# Patient Record
Sex: Male | Born: 1945 | Race: White | Hispanic: No | Marital: Married | Smoking: Former smoker
Health system: Southern US, Community
[De-identification: ages and names within clinical notes are randomized; demographics above are authoritative.]

## PROBLEM LIST (undated history)

## (undated) DIAGNOSIS — K219 Gastro-esophageal reflux disease without esophagitis: Secondary | ICD-10-CM

## (undated) DIAGNOSIS — M1990 Unspecified osteoarthritis, unspecified site: Secondary | ICD-10-CM

## (undated) DIAGNOSIS — E785 Hyperlipidemia, unspecified: Secondary | ICD-10-CM

## (undated) DIAGNOSIS — C4490 Unspecified malignant neoplasm of skin, unspecified: Secondary | ICD-10-CM

## (undated) DIAGNOSIS — R7301 Impaired fasting glucose: Secondary | ICD-10-CM

## (undated) DIAGNOSIS — K589 Irritable bowel syndrome without diarrhea: Secondary | ICD-10-CM

## (undated) HISTORY — DX: Unspecified osteoarthritis, unspecified site: M19.90

## (undated) HISTORY — DX: Impaired fasting glucose: R73.01

## (undated) HISTORY — DX: Unspecified malignant neoplasm of skin, unspecified: C44.90

## (undated) HISTORY — PX: WRIST SURGERY: SHX841

## (undated) HISTORY — DX: Irritable bowel syndrome without diarrhea: K58.9

## (undated) HISTORY — DX: Hyperlipidemia, unspecified: E78.5

## (undated) HISTORY — DX: Gastro-esophageal reflux disease without esophagitis: K21.9

## (undated) HISTORY — PX: TONSILLECTOMY: SUR1361

---

## 2001-08-08 ENCOUNTER — Encounter: Payer: Self-pay | Admitting: *Deleted

## 2001-08-08 ENCOUNTER — Emergency Department (HOSPITAL_COMMUNITY): Admission: EM | Admit: 2001-08-08 | Discharge: 2001-08-08 | Payer: Self-pay | Admitting: *Deleted

## 2002-06-27 ENCOUNTER — Ambulatory Visit (HOSPITAL_COMMUNITY): Admission: RE | Admit: 2002-06-27 | Discharge: 2002-06-27 | Payer: Self-pay | Admitting: Internal Medicine

## 2002-06-27 HISTORY — PX: COLONOSCOPY: SHX174

## 2002-10-30 ENCOUNTER — Encounter: Payer: Self-pay | Admitting: Orthopedic Surgery

## 2002-11-05 ENCOUNTER — Inpatient Hospital Stay (HOSPITAL_COMMUNITY): Admission: RE | Admit: 2002-11-05 | Discharge: 2002-11-08 | Payer: Self-pay | Admitting: Orthopedic Surgery

## 2002-11-05 HISTORY — PX: TOTAL KNEE ARTHROPLASTY: SHX125

## 2002-11-27 ENCOUNTER — Encounter (HOSPITAL_COMMUNITY): Admission: RE | Admit: 2002-11-27 | Discharge: 2002-12-27 | Payer: Self-pay | Admitting: Orthopedic Surgery

## 2004-06-15 ENCOUNTER — Emergency Department (HOSPITAL_COMMUNITY): Admission: EM | Admit: 2004-06-15 | Discharge: 2004-06-15 | Payer: Self-pay | Admitting: Emergency Medicine

## 2005-08-06 ENCOUNTER — Ambulatory Visit (HOSPITAL_COMMUNITY): Admission: RE | Admit: 2005-08-06 | Discharge: 2005-08-06 | Payer: Self-pay | Admitting: Urology

## 2005-08-06 HISTORY — PX: OTHER SURGICAL HISTORY: SHX169

## 2009-09-06 HISTORY — PX: TOTAL KNEE ARTHROPLASTY: SHX125

## 2009-09-24 ENCOUNTER — Inpatient Hospital Stay (HOSPITAL_COMMUNITY): Admission: RE | Admit: 2009-09-24 | Discharge: 2009-09-27 | Payer: Self-pay | Admitting: Orthopedic Surgery

## 2010-03-27 IMAGING — CR DG CHEST 2V
2 series · 2 of 2 positions shown · non-contrast
Comparison: 06/15/2004

CLINICAL DATA: Preadmission workup.  DJD right knee.

CHEST - 2 VIEW

[view not recorded (1 of 2)]
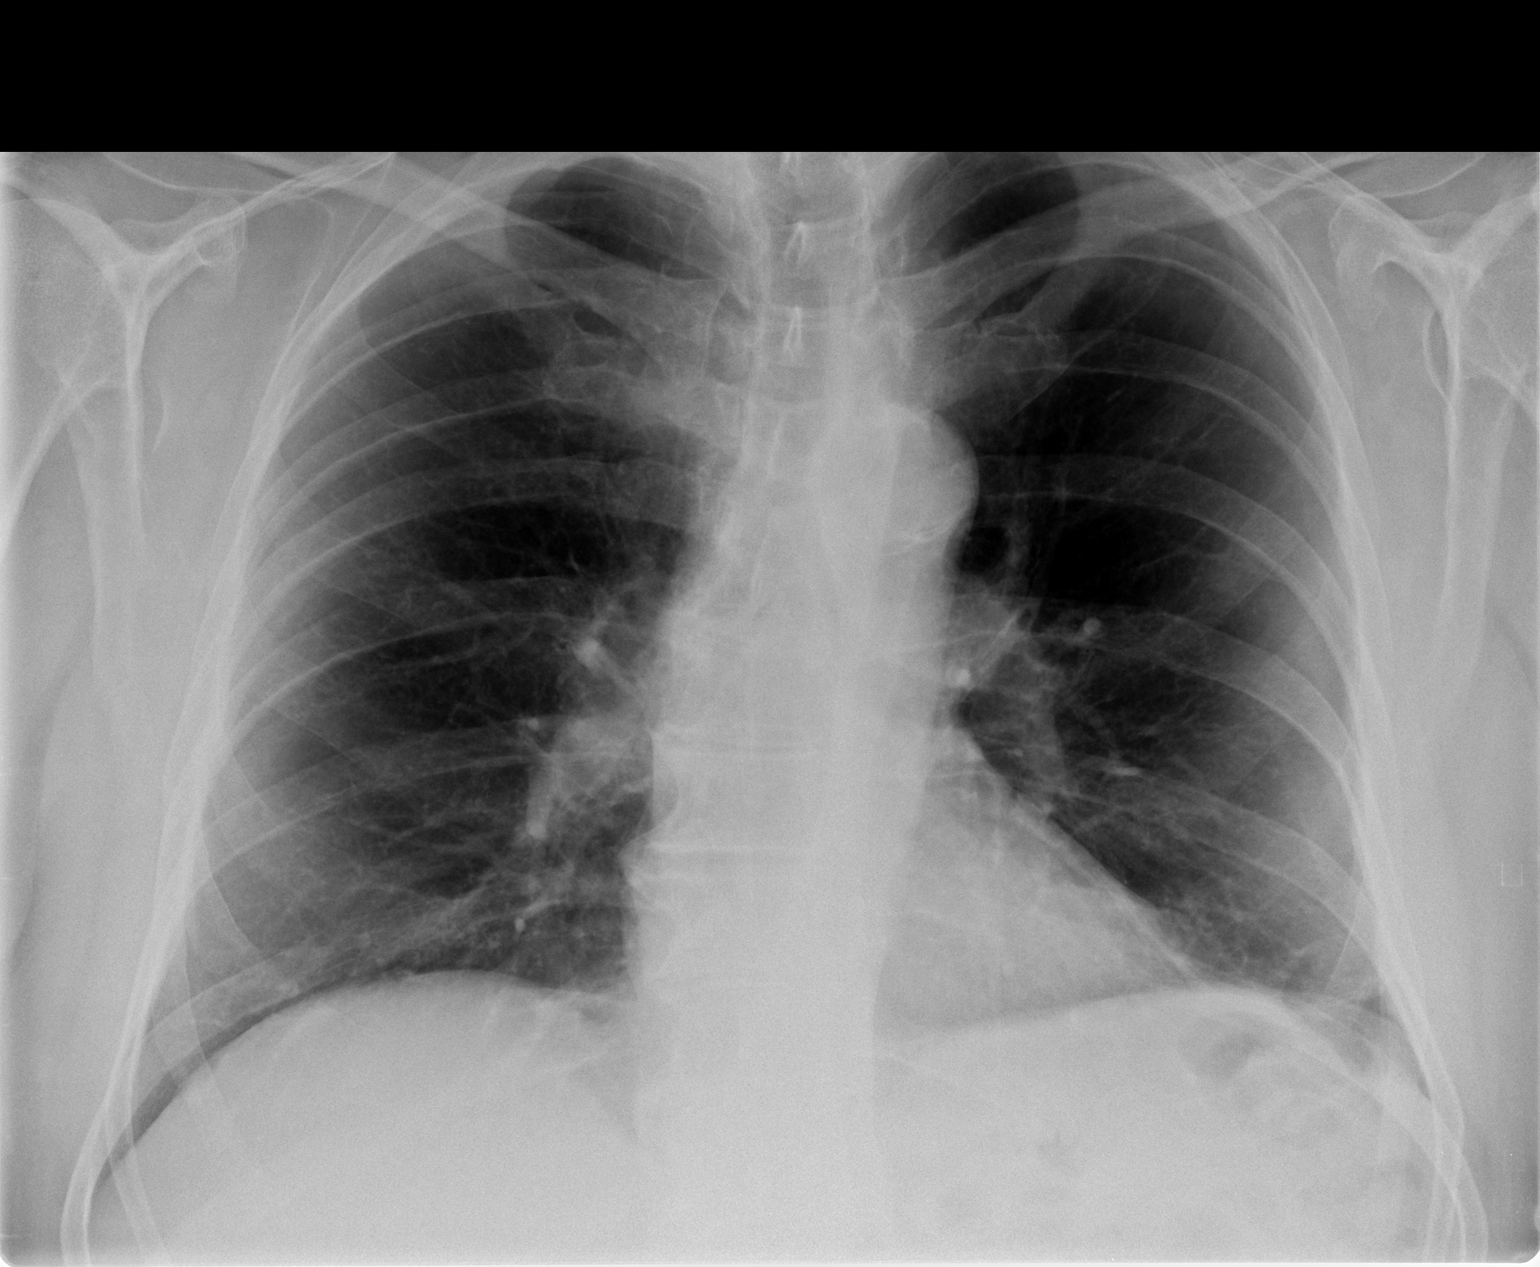

[view not recorded (2 of 2)]
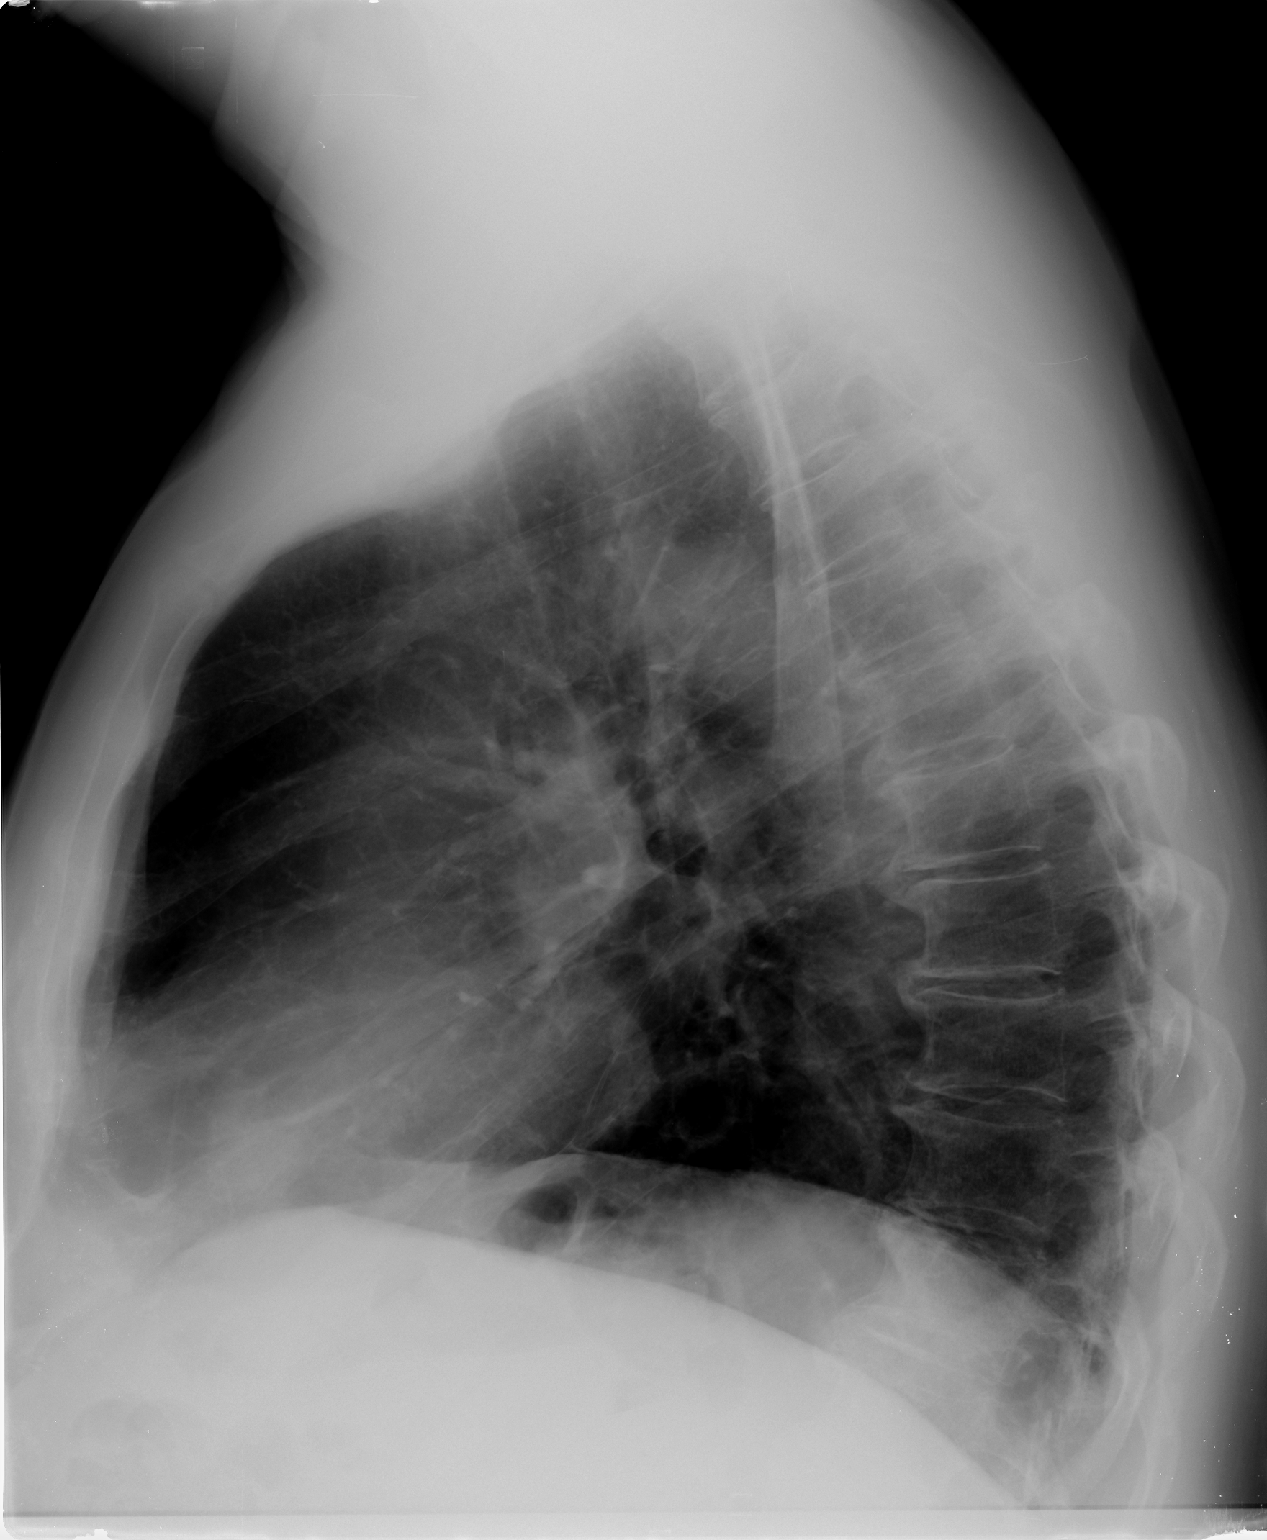

[2 of 2 positions shown; findings below may reference images not displayed]

FINDINGS: Normal cardiomediastinal silhouette.  No active pulmonary
process.  Minimal scarring at the left lateral base.  Spondylosis
of the dorsal spine.
IMPRESSION: No active chest disease.

## 2010-10-19 ENCOUNTER — Encounter (HOSPITAL_COMMUNITY): Payer: Self-pay | Admitting: Physical Therapy

## 2010-11-22 LAB — DIFFERENTIAL
Basophils Absolute: 0 10*3/uL (ref 0.0–0.1)
Basophils Relative: 1 % (ref 0–1)
Neutro Abs: 4.5 10*3/uL (ref 1.7–7.7)
Neutrophils Relative %: 52 % (ref 43–77)

## 2010-11-22 LAB — URINALYSIS, ROUTINE W REFLEX MICROSCOPIC
Bilirubin Urine: NEGATIVE
Ketones, ur: NEGATIVE mg/dL
Nitrite: NEGATIVE
Urobilinogen, UA: 0.2 mg/dL (ref 0.0–1.0)

## 2010-11-22 LAB — CBC
HCT: 43.9 % (ref 39.0–52.0)
HCT: 31.5 % — ABNORMAL LOW (ref 39.0–52.0)
HCT: 32.6 % — ABNORMAL LOW (ref 39.0–52.0)
HCT: 36.6 % — ABNORMAL LOW (ref 39.0–52.0)
Hemoglobin: 15.1 g/dL (ref 13.0–17.0)
Hemoglobin: 11 g/dL — ABNORMAL LOW (ref 13.0–17.0)
Hemoglobin: 11.5 g/dL — ABNORMAL LOW (ref 13.0–17.0)
Hemoglobin: 12.4 g/dL — ABNORMAL LOW (ref 13.0–17.0)
MCHC: 34.5 g/dL (ref 30.0–36.0)
MCHC: 34.9 g/dL (ref 30.0–36.0)
MCV: 88.4 fL (ref 78.0–100.0)
MCV: 88.9 fL (ref 78.0–100.0)
MCV: 90 fL (ref 78.0–100.0)
Platelets: 291 10*3/uL (ref 150–400)
Platelets: 190 10*3/uL (ref 150–400)
RBC: 4.97 MIL/uL (ref 4.22–5.81)
RBC: 3.51 MIL/uL — ABNORMAL LOW (ref 4.22–5.81)
RBC: 4.07 MIL/uL — ABNORMAL LOW (ref 4.22–5.81)
RDW: 13.3 % (ref 11.5–15.5)
RDW: 13.5 % (ref 11.5–15.5)
WBC: 8.8 10*3/uL (ref 4.0–10.5)
WBC: 9.6 10*3/uL (ref 4.0–10.5)

## 2010-11-22 LAB — BASIC METABOLIC PANEL
CO2: 26 mEq/L (ref 19–32)
GFR calc non Af Amer: >60 mL/min (ref >60)
Glucose, Bld: 158 mg/dL — ABNORMAL HIGH (ref 70–99)
Potassium: 3.7 mEq/L (ref 3.5–5.1)
Sodium: 136 mEq/L (ref 135–145)

## 2010-11-22 LAB — COMPREHENSIVE METABOLIC PANEL
ALT: 21 U/L (ref 0–53)
AST: 21 U/L (ref 0–37)
Albumin: 4.5 g/dL (ref 3.5–5.2)
Alkaline Phosphatase: 41 U/L (ref 39–117)
BUN: 19 mg/dL (ref 6–23)
CO2: 25 mEq/L (ref 19–32)
Calcium: 9.9 mg/dL (ref 8.4–10.5)
Chloride: 103 mEq/L (ref 96–112)
Creatinine, Ser: 0.98 mg/dL (ref 0.4–1.5)
GFR calc Af Amer: >60 mL/min (ref >60)
GFR calc non Af Amer: >60 mL/min (ref >60)
Glucose, Bld: 107 mg/dL — ABNORMAL HIGH (ref 70–99)
Potassium: 4.6 mEq/L (ref 3.5–5.1)
Sodium: 141 mEq/L (ref 135–145)
Total Bilirubin: 0.8 mg/dL (ref 0.3–1.2)
Total Protein: 6.9 g/dL (ref 6.0–8.3)

## 2010-11-22 LAB — TYPE AND SCREEN

## 2010-11-22 LAB — PROTIME-INR
INR: 0.96 (ref 0.00–1.49)
Prothrombin Time: 16 seconds — ABNORMAL HIGH (ref 11.6–15.2)

## 2010-11-22 LAB — APTT: aPTT: 26 seconds (ref 24–37)

## 2011-01-22 NOTE — Op Note (Signed)
Michael Adkins, Michael Adkins                         ACCOUNT NO.:  [id number]   MEDICAL RECORD NO.:  [id number]                   PATIENT TYPE:  INP   LOCATION:  2872                                 FACILITY:  [facility]   PHYSICIAN:  Harvie Junior, [name]                DATE OF BIRTH:  07-02-1946   DATE OF PROCEDURE:  11/05/2002  DATE OF DISCHARGE:                                 OPERATIVE REPORT   PREOPERATIVE DIAGNOSIS:  Degenerative joint disease, left knee, end stage.   POSTOPERATIVE DIAGNOSIS:  Degenerative joint disease, left knee, end stage.   PROCEDURE:  Left total knee replacement.   SURGEON:  Harvie Junior, [name]   ASSISTANT:  Marshia Ly, P.A.   ANESTHESIA:  General.   BRIEF HISTORY:  The patient is a 64 year old male with a long history of  having degenerative joint disease of his left knee.  Ultimately, he had  tried anti-inflammatory medication, activity modification, bracing; and none  of this seemed to work.  Because of continued complaint of pain, the patient  is ultimately taken to the operating room for a left total knee replacement.   PROCEDURE IN DETAIL:  The patient was taken to the operating room and after  adequate anesthesia was obtained with general anesthetic, the patient was  laid supine on the operating table.  His left leg was then prepped and  draped in the usual sterile fashion.  Following Esmarch exsanguination of  the extremity, the tourniquet was inflated to 350 mmHg.  Following this, a  midline incision was made through subcutaneous tissues and down to the level  of the extensor mechanism which was clearly identified, and a medial  parapatellar arthrotomy was undertaken.  Following this, medial and lateral  meniscectomies were performed as well as osteophyte removal, anterior and  posterior cruciate excision.  Following this, attention was turned towards  the tibia, where a cut was made perpendicular to the long axis of the tibia  with a 10-degree  posterior slope.  Following this, attention was turned  towards the femur where the femur was sized to a large, and the rotational  block was used for establishing the anterior and posterior cuts.  Following  this, attention was turned towards the flexion gap which was measured at 10  The block was put rotationally in place at this point, and the  anterior/posterior cuts were made at this point.  Following this, the 4-  degree valgus cutting block was used, and the distal femoral cut was made to  match the flexion gap to the extension gap.  At this time, a lollipop guide  was used to make sure that the knee would come easily into full extension,  and it would.  Slight hyperextension was observed.  At this point, attention  was turned back towards the chamfer cuts.  The cutting guide was put in  place, locked in place, and anterior  and posterior chamfers were cut as well  as the __________ being drilled.  Following this, the tibia was sized to 5,  and the wedges were put in place.  Following this, attention was turned  towards removal of all trial components, and the knee was put through a  range of motion with the trials in place.  The patella was then sized and  cut to 38.  Following this, a full range of motion was achieved easily with  the trial components.  Following this, all the components were removed.  The  knee was copiously irrigated and suctioned dry with pulse lavage irrigation,  and the final components were cemented into place; a size-5 cemented keel  tibia, large femoral component, 10-degree bridging bearing and a 38-mm all-  polyethylene patella.  Following this, the stability was checked and range  of motion was checked, and all were felt to be adequate.  A medium Hemovac  drain was put in place at this point, and the medial parapatellar  retinaculum was closed after all excess cement was removed and the cement  was allowed to harden.  The medial parapatellar arthrotomy was  closed with  #1 Vicryl running suture.  The subcuticular was closed with #0 and 2-0  Vicryl, and the skin was closed with staples.  A sterile compressive  dressing was applied.  The patient was taken to the recovery room and noted  to be satisfactory condition.  Estimated blood loss during the procedure was  none.                                                 Harvie Junior, [name]    Ranae Plumber  D:  11/05/2002  T:  11/05/2002  Job:  [id number]   cc:   Kingsley Callander. Ouida Sills, [name]  [address]  [place]  [place] [address]  Fax: [phone]

## 2011-01-22 NOTE — Op Note (Signed)
Michael Adkins, Michael Adkins                         ACCOUNT NO.:  [id number]   MEDICAL RECORD NO.:  [id number]                   PATIENT TYPE:  AMB   LOCATION:  DAY                                  FACILITY:  [facility]   PHYSICIAN:  R. Roetta Sessions, [name]              DATE OF BIRTH:  21-Feb-1946   DATE OF PROCEDURE:  06/27/2002  DATE OF DISCHARGE:                                 OPERATIVE REPORT   PROCEDURE:  Screening colonoscopy.   INDICATION FOR PROCEDURE:  The patient is a 65 year old gentleman referred  for colorectal cancer screening.  He is followed by Kingsley Callander. Ouida Sills, [name]  He  had a sigmoidoscopy done some five years ago, and no significant findings  were reported.  Colonoscopy is now being done as a standard screening  maneuver.  He has never had his entire lower GI tract evaluated.  There is  no family history of colorectal neoplasia, and the patient has no GI  symptoms.  Risks, benefits, and alternatives have been reviewed, questions  answered.  I believe he is at low risk for conscious sedation.   DESCRIPTION OF PROCEDURE:  O2 saturation, blood pressure, pulse, and  respiration were monitored throughout the entire procedure.  Conscious  sedation with Versed 3 mg IV, Demerol 75 mg IV.   Instrument:  Olympus video chip adult colonoscope.   Findings:  The digital rectal exam revealed no abnormalities.   Endoscopic findings:  Prep was good.   Rectum:  Examination of the rectal mucosa including retroflexed view of the  anal verge revealed only internal hemorrhoids.   Colon:  The colonic mucosa was surveyed from the rectosigmoid junction to  the left, transverse, right colon, to the area of the appendiceal orifice,  ileocecal valve, and cecum.  These structures were well-seen and  photographed for the record.  The patient was noted to have left-sided  diverticula.  The remainder of the colonic mucosa appeared normal to the  cecum.  From the level of the cecum and the ileocecal  valve, the scope was  slowly and cautiously withdrawn.  All previously-mentioned mucosal surfaces  were again seen, and again no other abnormalities were observed.  The  patient tolerated the procedure well, was reactivated in endoscopy.   IMPRESSION:  1. Internal hemorrhoids, otherwise normal rectum.  2. Left-sided diverticula.  The remaining colonic mucosa appeared normal.    RECOMMENDATIONS:  1. Diverticulosis literature provided to the patient.  2. Daily fiber supplementation.  3. Repeat colonoscopy 10 years.                                                 Jonathon Bellows, [name]    RMR/MEDQ  D:  06/27/2002  T:  06/27/2002  Job:  [id number]   cc:  Kingsley Callander. Ouida Sills, [name]

## 2011-01-22 NOTE — Discharge Summary (Signed)
NAMEDUAINE, RADIN                         ACCOUNT NO.:  [id number]   MEDICAL RECORD NO.:  [id number]                   PATIENT TYPE:  INP   LOCATION:  5040                                 FACILITY:  [facility]   PHYSICIAN:  Harvie Junior, [name]                DATE OF BIRTH:  04/17/1946   DATE OF ADMISSION:  11/05/2002  DATE OF DISCHARGE:  11/08/2002                                 DISCHARGE SUMMARY   ADMISSION DIAGNOSES:  1. End-stage degenerative joint disease left knee.  2. History of gastroesophageal reflux disease.   DISCHARGE DIAGNOSES:  1. End-stage degenerative joint disease left knee.  2. History of gastroesophageal reflux disease.   PROCEDURE:  Left total knee arthroplasty per Dr. Jodi Geralds on November 05, 2002.   HISTORY OF PRESENT ILLNESS:  The patient is a 65 year old male who has a  long history of pain in his left knee.  He has a history of an arthrotomy in  the left knee in the 1960s.  He had a left knee scope done in 1987.  X-ray  showed bone on bone arthritis.  He had night pain and pain on ambulation and  had gotten only temporary relief with the use of anti-inflammatory  medication and Cortisone injections.  Because of his persistent problems and  radiographic findings, he was felt to be a candidate for a total knee  replacement and was admitted for this.   PERTINENT LABORATORY DATA:  EKG showed normal sinus rhythm.   Hemoglobin on admission was 14.3, hematocrit 41.3.  On postoperative day #1,  hemoglobin was 12.2; on postoperative day #2, it was 10.8; on postoperative  day #3, it was 11.4.  Protime on admission was 13 seconds and INR of 0.9,  PTT was 26.  On the day of discharge his protime was 14 seconds with INR of  1.1.  CMET was entirely normal, sodium was decreased at 131 and glucose  slightly elevated at 155.  Urinalysis showed no abnormalities.   HOSPITAL COURSE:  The patient underwent left total knee replacement as well  described in Dr. Luiz Blare'  operative note.  He was treated with an epidural  Fentanyl catheter for postoperative pain control per anesthesia.  He was put  on IV Ancef 1 g q.8h. x6 doses and was started on Coumadin on postoperative  day #1 since he had the epidural in place.   On postoperative day #1, he had moderate left knee pain.  He was taking  fluids without difficulty.  His Foley catheter which had been placed at the  time of surgery was intact.  He was out of bed to the chair.  He had a low  grade fever of 100.8.  His hemoglobin was 12.2 and his INR was 1.1.  His  potassium was 3.6.  Physical therapy saw the patient for walker ambulation  and weightbearing as tolerated.  He also continued  to use the CPM machine.   On postoperative day #2, he was without complaints.  He had spiked a fever  up to 101 but was then found to be afebrile.  His hemoglobin was stable at  10.8.  His INR was 1.2.  His dressing was changed and his Hemovac drain was  pulled.  His epidural was also discontinued per anesthesia.  He was started  on Lovenox subcutaneous six hours after the epidural was discontinued to  decrease the risk of DVT.  On postoperative day #3, the patient was doing  well.  He was taking no pain medication.  He had stable hemoglobin.  His  lungs were clear to auscultation.  __________ was intact to his left lower  extremity.  His calf was soft.  His left knee wound was benign.  He was  discharged home in improved condition.   ACTIVITY:  He will be weightbearing as tolerated on the left with a walker.  He will use a home CPM machine for range of motion.  He will get home health  physical therapy.   DISCHARGE MEDICATIONS:  1. Percocet p.r.n. for pain.  2. Coumadin antithrombolytic therapy per pharmacy protocol x1 month     postoperatively.  3. Lovenox 30 mg subcutaneous b.i.d. until his INR is greater than 2.0.   FOLLOW UP:  He will follow up with Dr. Luiz Blare in two weeks, call if any  problems occur.      Marshia Ly, P.A.                       Harvie Junior, [name]    Cordelia Pen  D:  01/09/2003  T:  01/11/2003  Job:  [id number]   cc:   Kingsley Callander. Ouida Sills, [name]  [address]  [place]  [place] [address]  Fax: [phone]

## 2011-01-22 NOTE — Op Note (Signed)
NAMECYLAS, FALZONE               ACCOUNT NO.:  [id number]   MEDICAL RECORD NO.:  [id number]          PATIENT TYPE:  AMB   LOCATION:  DAY                           FACILITY:  [facility]   PHYSICIAN:  Ky Barban, M.D.DATE OF BIRTH:  03-27-1946   DATE OF PROCEDURE:  08/06/2005  DATE OF DISCHARGE:                                 OPERATIVE REPORT   PREOPERATIVE DIAGNOSIS:  Left hydrocele.   POSTOPERATIVE DIAGNOSIS:  Left hydrocele.   PROCEDURE:  Left hydrocelectomy.   ANESTHESIA:  Spinal.   PROCEDURE NOTE:  The patient under spinal anesthesia, usual prep and drape.  Left hemi-scrotum was held by the assistant. Transverse incision across the  mid scrotum is married, carried down to the fascial layers with the help of  cautery and tunica vaginalis was exposed. It was opened. About 100 cc of  clear, amber-colored fluid was drained. The testicle is delivered through  it, and the hydrocele track is everted, and starting from one side of the  hydrocele track. Imbricating stitches placed using 3-0 Vicryl. The sac was  imbricated next to the testicle. After circumferentially closing the  hydrocele sac, there was one section of the sac going towards the spermatic  cord in the groin. It was everted. Then it was also imbricated  circumferentially using 3-0 Vicryl stitch. The testicle was replaced. There  was no bleeding. Some of the bleeders were coagulated, and then the fascial  layers were closed with continuous stitch of 3-0 Vicryl horizontal mattress  stitch in a locking fashion. The skin was closed with horizontal mattress  stitch using 3-0 chromic. Bacitracin ointment applied to the incision, and a  fluff dressing was applied. The patient left the operating room in  satisfactory condition.      Ky Barban, [name]  Electronically Signed     MIJ/MEDQ  D:  08/06/2005  T:  08/06/2005  Job:  [id number]

## 2012-09-08 LAB — CBC WITH DIFFERENTIAL/PLATELET
HCT: 45 %
Hemoglobin: 15.5 g/dL (ref 13.5–17.5)
WBC: 9
platelet count: 299

## 2012-09-08 LAB — COMPREHENSIVE METABOLIC PANEL
ALT: 7 U/L — AB (ref 10–40)
AST: 16 U/L
Alkaline Phosphatase: 16 U/L
Calcium: 9.9 mg/dL
Potassium: 5.7 mmol/L
Sodium: 142 mmol/L (ref 137–147)
Total Bilirubin: 46 mg/dL
Total Protein: 4.6 g/dL

## 2012-10-02 ENCOUNTER — Encounter: Payer: Self-pay | Admitting: Internal Medicine

## 2012-10-03 ENCOUNTER — Encounter (HOSPITAL_COMMUNITY): Payer: Self-pay | Admitting: Pharmacy Technician

## 2012-10-03 ENCOUNTER — Ambulatory Visit (INDEPENDENT_AMBULATORY_CARE_PROVIDER_SITE_OTHER): Payer: Medicare Other | Admitting: Urgent Care

## 2012-10-03 ENCOUNTER — Encounter: Payer: Self-pay | Admitting: Urgent Care

## 2012-10-03 VITALS — BP 130/80 | HR 84 | Temp 98.4°F | Ht 72.0 in | Wt 237.4 lb

## 2012-10-03 DIAGNOSIS — K921 Melena: Secondary | ICD-10-CM

## 2012-10-03 DIAGNOSIS — E875 Hyperkalemia: Secondary | ICD-10-CM

## 2012-10-03 DIAGNOSIS — K219 Gastro-esophageal reflux disease without esophagitis: Secondary | ICD-10-CM | POA: Insufficient documentation

## 2012-10-03 MED ORDER — PEG 3350-KCL-NA BICARB-NACL 420 G PO SOLR: 4000.0000 mL | ORAL | Status: DC

## 2012-10-03 NOTE — Progress Notes (Signed)
Results faxed to PCP 

## 2012-10-03 NOTE — Progress Notes (Signed)
Referring Provider: No ref. provider found Primary Care Physician:  Carylon Perches, MD Primary Gastroenterologist:  Dr. Jonette Eva  Chief Complaint  Patient presents with  . Colonoscopy  . Gastrophageal Reflux    HPI:  Michael Adkins is a 67 y.o. male here as a referral from Dr. Ouida Sills for hemoccult positive stool to set up colonoscopy.  Mr. Schwabe has seen small to moderate amounts of bright red blood with wiping on the toilet tissue for several months.  Occasionally he will strain with hard stools.  Usually has a BM daily or QOD.  He does not take any medications for constipation.  Denies any NSAIDS.  He reports acid reflux heartburn & indigestion at least several times per week.  He takes pepcid prn with good results.  He denies dysphagia or odynophagia.   Denies nausea, vomiting, or anorexia.   Denies diarrhea, melena or weight loss.  He is due for colonosocpy as his last colonoscopy was in 2003 by Dr Jena Gauss where he was found to have internal hemorrhoids & left-sided diverticulosis.  He carries hx IBS but states he really has not had any problems with it recently.  Labs reviewed from [facility] show K 5.7, glucose 114.  He gives no hx CKD or meds that would cause this.  Hgb is 15.5.  CBC & CMP otherwise normal, PSA normal.   Past Medical History  Diagnosis Date  . Osteoarthritis   . Hyperlipidemia   . Impaired fasting glucose   . IBS (irritable bowel syndrome)   . Skin cancer     cannot remember type, [facility]    Past Surgical History  Procedure Date  . Colonoscopy 06/27/2002    RMR: Internal hemorrhoids, otherwise normal rectum/ Left-sided diverticula.  The remaining colonic mucosa appeared normal  . Total knee arthroplasty 11/05/2002    left  . Left hydrocele 08/06/2005  . Total knee arthroplasty 2011    right  . Wrist surgery   . Tonsillectomy     Current Outpatient Prescriptions  Medication Sig Dispense Refill  . aspirin 81 MG tablet Take 81 mg by mouth daily.      .  famotidine (PEPCID) 10 MG tablet Take 10 mg by mouth 2 (two) times daily. PRN      . simvastatin (ZOCOR) 40 MG tablet Take 40 mg by mouth at bedtime.       . polyethylene glycol-electrolytes (TRILYTE) 420 G solution Take 4,000 mLs by mouth as directed.  4000 mL  0    Allergies as of 10/03/2012  . (No Known Allergies)    Family History:There is no known family history of colorectal carcinoma, liver disease, or inflammatory bowel disease.  Problem Relation Age of Onset  . Colon polyps Brother 58  . Lung cancer Brother 81  . COPD Mother     History   Social History  . Marital Status: Married    Spouse Name: N/A    Number of Children: N/A  . Years of Education: N/A   Occupational History  . retired, Administrator, arts runner    Social History Main Topics  . Smoking status: Former Smoker -- 1.0 packs/day for 30 years    Types: Cigarettes    Quit date: 10/03/1990  . Smokeless tobacco: Not on file  . Alcohol Use: No  . Drug Use: No  . Sexually Active: Not on file   Other Topics Concern  . Not on file   Social History Narrative   Lives w/ wife, 1 son (step)  Review of Systems: Gen: Denies any fever, chills, sweats, anorexia, fatigue, weakness, malaise, weight loss, and sleep disorder CV: Denies chest pain, angina, palpitations, syncope, orthopnea, PND, peripheral edema, and claudication. Resp: Denies dyspnea at rest, dyspnea with exercise, cough, sputum, wheezing, coughing up blood, and pleurisy. GI: Denies vomiting blood, jaundice, and fecal incontinence. GU : Denies urinary burning, blood in urine, urinary frequency, urinary hesitancy, nocturnal urination, and urinary incontinence. MS: Denies joint pain, limitation of movement, and swelling, stiffness, low back pain, extremity pain. Denies muscle weakness, cramps, atrophy.  Derm: Denies rash, itching, dry skin, hives, moles, warts, or unhealing ulcers.  Psych: Denies depression, anxiety, memory loss, suicidal ideation,  hallucinations, paranoia, and confusion. Heme: Denies bruising or enlarged lymph nodes. Neuro:  Denies any headaches, dizziness, paresthesias. Endo:  Denies any problems with DM, thyroid, adrenal function.  Physical Exam: BP 130/80  Pulse 84  Temp 98.4 F (36.9 C) (Oral)  Ht 6' (1.829 m)  Wt 237 lb 6.4 oz (107.684 kg)  BMI 32.20 kg/m2 No LMP for male patient. General:   Alert,  Well-developed, well-nourished, pleasant and cooperative in NAD Head:  Normocephalic and atraumatic. Eyes:  Sclera clear, no icterus.   Conjunctiva pink. Ears:  Normal auditory acuity. Nose:  No deformity, discharge, or lesions. Mouth:  No deformity or lesions,oropharynx pink & moist. Neck:  Supple; no masses or thyromegaly. Lungs:  Clear throughout to auscultation.   No wheezes, crackles, or rhonchi. No acute distress. Heart:  Regular rate and rhythm; no murmurs, clicks, rubs,  or gallops. Abdomen:  Normal bowel sounds.  No bruits.  Soft, non-tender and non-distended without masses, hepatosplenomegaly or hernias noted.  No guarding or rebound tenderness.   Rectal:  Deferred. Msk:  Symmetrical without gross deformities. Normal posture. Pulses:  Normal pulses noted. Extremities:  No clubbing or edema. Neurologic:  Alert and oriented x4;  grossly normal neurologically. Skin:  Intact without significant lesions or rashes. Lymph Nodes:  No significant cervical adenopathy. Psych:  Alert and cooperative. Normal mood and affect.

## 2012-10-03 NOTE — Progress Notes (Signed)
Quick Note:  Please let pt know his K is barely elevated This should be OK for colonoscopy as prep will decrease potassium. However, he should follow up with his PCP for high potassium. ZO:XWRUE,AVW, MD  ______

## 2012-10-03 NOTE — Patient Instructions (Addendum)
Follow up with Dr Carylon Perches, MD for your blood pressure Colonoscopy & EGD (upper endoscopy)  with Dr Darrick Penna Gastroesophageal Reflux Disease, Adult Gastroesophageal reflux disease (GERD) happens when acid from your stomach flows up into the esophagus. When acid comes in contact with the esophagus, the acid causes soreness (inflammation) in the esophagus. Over time, GERD may create small holes (ulcers) in the lining of the esophagus. CAUSES   Increased body weight. This puts pressure on the stomach, making acid rise from the stomach into the esophagus.  Smoking. This increases acid production in the stomach.  Drinking alcohol. This causes decreased pressure in the lower esophageal sphincter (valve or ring of muscle between the esophagus and stomach), allowing acid from the stomach into the esophagus.  Late evening meals and a full stomach. This increases pressure and acid production in the stomach.  A malformed lower esophageal sphincter. Sometimes, no cause is found. SYMPTOMS   Burning pain in the lower part of the mid-chest behind the breastbone and in the mid-stomach area. This may occur twice a week or more often.  Trouble swallowing.  Sore throat.  Dry cough.  Asthma-like symptoms including chest tightness, shortness of breath, or wheezing. DIAGNOSIS  Your caregiver may be able to diagnose GERD based on your symptoms. In some cases, X-rays and other tests may be done to check for complications or to check the condition of your stomach and esophagus. TREATMENT  Your caregiver may recommend over-the-counter or prescription medicines to help decrease acid production. Ask your caregiver before starting or adding any new medicines.  HOME CARE INSTRUCTIONS   Change the factors that you can control. Ask your caregiver for guidance concerning weight loss, quitting smoking, and alcohol consumption.  Avoid foods and drinks that make your symptoms worse, such as:  Caffeine or alcoholic  drinks.  Chocolate.  Peppermint or mint flavorings.  Garlic and onions.  Spicy foods.  Citrus fruits, such as oranges, lemons, or limes.  Tomato-based foods such as sauce, chili, salsa, and pizza.  Fried and fatty foods.  Avoid lying down for the 3 hours prior to your bedtime or prior to taking a nap.  Eat small, frequent meals instead of large meals.  Wear loose-fitting clothing. Do not wear anything tight around your waist that causes pressure on your stomach.  Raise the head of your bed 6 to 8 inches with wood blocks to help you sleep. Extra pillows will not help.  Only take over-the-counter or prescription medicines for pain, discomfort, or fever as directed by your caregiver.  Do not take aspirin, ibuprofen, or other nonsteroidal anti-inflammatory drugs (NSAIDs). SEEK IMMEDIATE MEDICAL CARE IF:   You have pain in your arms, neck, jaw, teeth, or back.  Your pain increases or changes in intensity or duration.  You develop nausea, vomiting, or sweating (diaphoresis).  You develop shortness of breath, or you faint.  Your vomit is green, yellow, black, or looks like coffee grounds or blood.  Your stool is red, bloody, or black. These symptoms could be signs of other problems, such as heart disease, gastric bleeding, or esophageal bleeding. MAKE SURE YOU:   Understand these instructions.  Will watch your condition.  Will get help right away if you are not doing well or get worse. Document Released: 06/02/2005 Document Revised: 11/15/2011 Document Reviewed: 03/12/2011 [facility] Patient Information 2013 [place], [place]. Bloody Stools Bloody stools often mean that there is a problem in the digestive tract. Your caregiver may use the term "melena" to describe black, tarry,  and bad smelling stools or "hematochezia" to describe red or maroon-colored stools. Blood seen in the stool can be caused by bleeding anywhere along the intestinal tract.  A black stool usually means  that blood is coming from the upper part of the gastrointestinal tract (esophagus, stomach, or small bowel). Passing maroon-colored stools or bright red blood usually means that blood is coming from lower down in the large bowel or the rectum. However, sometimes massive bleeding in the stomach or small intestine can cause bright red bloody stools.  Consuming black licorice, lead, iron pills, medicines containing bismuth subsalicylate, or blueberries can also cause black stools. Your caregiver can test black stools to see if blood is present. It is important that the cause of the bleeding be found. Treatment can then be started, and the problem can be corrected. Rectal bleeding may not be serious, but you should not assume everything is okay until you know the cause.It is very important to follow up with your caregiver or a specialist in gastrointestinal problems. CAUSES  Blood in the stools can come from various underlying causes.Often, the cause is not found during your first visit. Testing is often needed to discover the cause of bleeding in the gastrointestinal tract. Causes range from simple to serious or even life-threatening.Possible causes include:  Hemorrhoids.These are veins that are full of blood (engorged) in the rectum. They cause pain, inflammation, and may bleed.  Anal fissures.These are areas of painful tearing which may bleed. They are often caused by passing hard stool.  Diverticulosis.These are pouches that form on the colon over time, with age, and may bleed significantly.  Diverticulitis.This is inflammation in areas with diverticulosis. It can cause pain, fever, and bloody stools, although bleeding is rare.  Proctitis and colitis. These are inflamed areas of the rectum or colon. They may cause pain, fever, and bloody stools.  Polyps and cancer. Colon cancer is a leading cause of preventable cancer death.It often starts out as precancerous polyps that can be removed during  a colonoscopy, preventing progression into cancer. Sometimes, polyps and cancer may cause rectal bleeding.  Gastritis and ulcers.Bleeding from the upper gastrointestinal tract (near the stomach) may travel through the intestines and produce black, sometimes tarry, often bad smelling stools. In certain cases, if the bleeding is fast enough, the stools may not be black, but red and the condition may be life-threatening. SYMPTOMS  You may have stools that are bright red and bloody, that are normal color with blood on them, or that are dark black and tarry. In some cases, you may only have blood in the toilet bowl. Any of these cases need medical care. You may also have:  Pain at the anus or anywhere in the rectum.  Lightheadedness or feeling faint.  Extreme weakness.  Nausea or vomiting.  Fever. DIAGNOSIS Your caregiver may use the following methods to find the cause of your bleeding:  Taking a medical history. Age is important. Older people tend to develop polyps and cancer more often. If there is anal pain and a hard, large stool associated with bleeding, a tear of the anus may be the cause. If blood drips into the toilet after a bowel movement, bleeding hemorrhoids may be the problem. The color and frequency of the bleeding are additional considerations. In most cases, the medical history provides clues, but seldom the final answer.  A visual and finger (digital) exam. Your caregiver will inspect the anal area, looking for tears and hemorrhoids. A finger exam can provide  information when there is tenderness or a growth inside. In men, the prostate is also examined.  Endoscopy. Several types of small, long scopes (endoscopes) are used to view the colon.  In the office, your caregiver may use a rigid, or more commonly, a flexible viewing sigmoidoscope. This exam is called flexible sigmoidoscopy. It is performed in 5 to 10 minutes.  A more thorough exam is accomplished with a colonoscope. It  allows your caregiver to view the entire 5 to 6 foot long colon. Medicine to help you relax (sedative) is usually given for this exam. Frequently, a bleeding lesion may be present beyond the reach of the sigmoidoscope. So, a colonoscopy may be the best exam to start with. Both exams are usually done on an outpatient basis. This means the patient does not stay overnight in the hospital or surgery center.  An upper endoscopy may be needed to examine your stomach. Sedation is used and a flexible endoscope is put in your mouth, down to your stomach.  A barium enema X-ray. This is an X-ray exam. It uses liquid barium inserted by enema into the rectum. This test alone may not identify an actual bleeding point. X-rays highlight abnormal shadows, such as those made by lumps (tumors), diverticuli, or colitis. TREATMENT  Treatment depends on the cause of your bleeding.   For bleeding from the stomach or colon, the caregiver doing your endoscopy or colonoscopy may be able to stop the bleeding as part of the procedure.  Inflammation or infection of the colon can be treated with medicines.  Many rectal problems can be treated with creams, suppositories, or warm baths.  Surgery is sometimes needed.  Blood transfusions are sometimes needed if you have lost a lot of blood.  For any bleeding problem, let your caregiver know if you take aspirin or other blood thinners regularly. HOME CARE INSTRUCTIONS   Take any medicines exactly as prescribed.  Keep your stools soft by eating a diet high in fiber. Prunes (1 to 3 a day) work well for many people.  Drink enough water and fluids to keep your urine clear or pale yellow.  Take sitz baths if advised. A sitz bath is when you sit in a bathtub with warm water for 10 to 15 minutes to soak, soothe, and cleanse the rectal area.  If enemas or suppositories are advised, be sure you know how to use them. Tell your caregiver if you have problems with this.  Monitor  your bowel movements to look for signs of improvement or worsening. SEEK MEDICAL CARE IF:   You do not improve in the time expected.  Your condition worsens after initial improvement.  You develop any new symptoms. SEEK IMMEDIATE MEDICAL CARE IF:   You develop severe or prolonged rectal bleeding.  You vomit blood.  You feel weak or faint.  You have a fever. MAKE SURE YOU:  Understand these instructions.  Will watch your condition.  Will get help right away if you are not doing well or get worse. Document Released: 08/13/2002 Document Revised: 11/15/2011 Document Reviewed: 01/08/2011 [facility] Patient Information 2013 [place], [place].

## 2012-10-04 ENCOUNTER — Encounter: Payer: Self-pay | Admitting: Urgent Care

## 2012-10-04 DIAGNOSIS — E875 Hyperkalemia: Secondary | ICD-10-CM | POA: Insufficient documentation

## 2012-10-04 NOTE — Assessment & Plan Note (Addendum)
He has chronic GERD with episodes three times per week controlled with OTC pepcid.  Screening EGD to look for Barrett's esophagus at the same time as colonoscopy.  I have discussed risks & benefits which include, but are not limited to, bleeding, infection, perforation & drug reaction.  The patient agrees with this plan & written consent will be obtained.    Offered daily PPI, pt declined Will continue PRN OTC pepcid 20mg . GERD literature

## 2012-10-04 NOTE — Progress Notes (Signed)
Faxed to PCP

## 2012-10-04 NOTE — Assessment & Plan Note (Addendum)
Michael Adkins is a pleasant 67 y.o. male with small to moderate volume hematochezia & heme positive through his PCP.  He will need diagnostic colonoscopy with Dr Darrick Penna as his last colonoscopy was 10 years ago.  He did have internal hemorrhoids that may be the culprit, cannot r/o fissure, colorectal carcinoma or polyp, or less likely diverticular bleeding.  I have discussed risks & benefits which include, but are not limited to, bleeding, infection, perforation & drug reaction.  The patient agrees with this plan & written consent will be obtained.    Rectal bleed literature/precautions given

## 2012-10-04 NOTE — Progress Notes (Signed)
Quick Note:  LMOM to call. ______ 

## 2012-10-04 NOTE — Assessment & Plan Note (Signed)
Incidental note of K 5.7 on last labs may be due to hemolysis.  Recheck prior to procedures.

## 2012-10-09 NOTE — Progress Notes (Signed)
Quick Note:  Called and informed pt. ______ 

## 2012-10-09 NOTE — Progress Notes (Signed)
Results faxed to PCP 

## 2012-10-09 NOTE — Progress Notes (Signed)
Quick Note:  Routing to [facility] to send lab to PCP. ______

## 2012-10-17 ENCOUNTER — Encounter (HOSPITAL_COMMUNITY)
Admission: RE | Disposition: A | Discharge status: Home / Self Care | Payer: Self-pay | Source: Ambulatory Visit | Attending: Gastroenterology

## 2012-10-17 ENCOUNTER — Ambulatory Visit (HOSPITAL_COMMUNITY)
Admission: RE | Admit: 2012-10-17 | Discharge: 2012-10-17 | Disposition: A | Discharge status: Home / Self Care | Payer: Medicare Other | Source: Ambulatory Visit | Attending: Gastroenterology | Admitting: Gastroenterology

## 2012-10-17 ENCOUNTER — Encounter (HOSPITAL_COMMUNITY): Payer: Self-pay | Admitting: *Deleted

## 2012-10-17 DIAGNOSIS — K2270 Barrett's esophagus without dysplasia: Secondary | ICD-10-CM | POA: Insufficient documentation

## 2012-10-17 DIAGNOSIS — K449 Diaphragmatic hernia without obstruction or gangrene: Secondary | ICD-10-CM | POA: Insufficient documentation

## 2012-10-17 DIAGNOSIS — D126 Benign neoplasm of colon, unspecified: Secondary | ICD-10-CM

## 2012-10-17 DIAGNOSIS — K2990 Gastroduodenitis, unspecified, without bleeding: Secondary | ICD-10-CM

## 2012-10-17 DIAGNOSIS — K219 Gastro-esophageal reflux disease without esophagitis: Secondary | ICD-10-CM

## 2012-10-17 DIAGNOSIS — K648 Other hemorrhoids: Secondary | ICD-10-CM | POA: Insufficient documentation

## 2012-10-17 DIAGNOSIS — K921 Melena: Secondary | ICD-10-CM

## 2012-10-17 DIAGNOSIS — K5730 Diverticulosis of large intestine without perforation or abscess without bleeding: Secondary | ICD-10-CM | POA: Insufficient documentation

## 2012-10-17 DIAGNOSIS — K2970 Gastritis, unspecified, without bleeding: Secondary | ICD-10-CM

## 2012-10-17 DIAGNOSIS — A048 Other specified bacterial intestinal infections: Secondary | ICD-10-CM | POA: Insufficient documentation

## 2012-10-17 DIAGNOSIS — E875 Hyperkalemia: Secondary | ICD-10-CM

## 2012-10-17 DIAGNOSIS — K625 Hemorrhage of anus and rectum: Secondary | ICD-10-CM | POA: Insufficient documentation

## 2012-10-17 DIAGNOSIS — K210 Gastro-esophageal reflux disease with esophagitis: Secondary | ICD-10-CM

## 2012-10-17 DIAGNOSIS — K2940 Chronic atrophic gastritis without bleeding: Secondary | ICD-10-CM | POA: Insufficient documentation

## 2012-10-17 HISTORY — PX: COLONOSCOPY WITH ESOPHAGOGASTRODUODENOSCOPY (EGD): SHX5779

## 2012-10-17 SURGERY — COLONOSCOPY WITH ESOPHAGOGASTRODUODENOSCOPY (EGD)
Anesthesia: Moderate Sedation

## 2012-10-17 MED ORDER — MIDAZOLAM HCL 5 MG/5ML IJ SOLN
INTRAMUSCULAR | Status: DC | PRN
  Administered 2012-10-17 (×3): 2 mg via INTRAVENOUS
  Administered 2012-10-17: 1 mg via INTRAVENOUS

## 2012-10-17 MED ORDER — MEPERIDINE HCL 100 MG/ML IJ SOLN
INTRAMUSCULAR | Status: AC
  Filled 2012-10-17: qty 1

## 2012-10-17 MED ORDER — OMEPRAZOLE 20 MG PO CPDR: DELAYED_RELEASE_CAPSULE | ORAL | Status: DC

## 2012-10-17 MED ORDER — STERILE WATER FOR IRRIGATION IR SOLN
Status: DC | PRN
  Administered 2012-10-17: 09:00:00

## 2012-10-17 MED ORDER — BUTAMBEN-TETRACAINE-BENZOCAINE 2-2-14 % EX AERO
INHALATION_SPRAY | CUTANEOUS | Status: DC | PRN
  Administered 2012-10-17: 2 via TOPICAL

## 2012-10-17 MED ORDER — SODIUM CHLORIDE 0.45 % IV SOLN
INTRAVENOUS | Status: DC
  Administered 2012-10-17: 09:00:00 via INTRAVENOUS

## 2012-10-17 MED ORDER — FAMOTIDINE 10 MG PO TABS: 10.0000 mg | ORAL_TABLET | Freq: Two times a day (BID) | ORAL | Status: AC

## 2012-10-17 MED ORDER — MEPERIDINE HCL 100 MG/ML IJ SOLN
INTRAMUSCULAR | Status: DC | PRN
  Administered 2012-10-17 (×4): 25 mg via INTRAVENOUS

## 2012-10-17 MED ORDER — MIDAZOLAM HCL 5 MG/5ML IJ SOLN
INTRAMUSCULAR | Status: AC
  Filled 2012-10-17: qty 10

## 2012-10-17 NOTE — Op Note (Addendum)
[facility] [address] [place], [address]   ENDOSCOPY PROCEDURE REPORT  PATIENT: Michael, Adkins  MR#: [id number] BIRTHDATE: 05/29/1946 , 66  yrs. old GENDER: Male  ENDOSCOPIST: Jonette Eva, MD REFERRED BY:[name], [name]  PROCEDURE DATE: 10/17/2012 PROCEDURE:   EGD w/ biopsy INDICATIONS:Barrett's screening. PT TAKING PEPCID. DENIES DYSPHAGIA. MEDICATIONS: TCS + Demerol 50 mg IV and Versed 1mg  IV TOPICAL ANESTHETIC:   Cetacaine Spray  DESCRIPTION OF PROCEDURE:     Physical exam was performed.  Informed consent was obtained from the patient after explaining the benefits, risks, and alternatives to the procedure.  The patient was connected to the monitor and placed in the left lateral position.  Continuous oxygen was provided by nasal cannula and IV medicine administered through an indwelling cannula.  After administration of sedation, the patients esophagus was intubated and the [id number] ([id number])  endoscope was advanced under direct visualization to the second portion of the duodenum.  The scope was removed slowly by carefully examining the color, texture, anatomy, and integrity of the mucosa on the way out.  The patient was recovered in endoscopy and discharged home in satisfactory condition.   ESOPHAGUS: UES 18 CM FROM THE TEETH.  EROSIVE ESOPHAGITIS BEGINNING 37 CM FROM THE TEETH AND EXTENDING TO THE GE JUNCTION 40 CM FROM THE TEETH.  COLD FORCEPS BIOPSIES OBTAINED AT 37 CM AND 39 CM FROM THE TEETH.   A small sliding hiatal hernia was noted.   STOMACH: Non-erosive gastritis (inflammation) was found in the gastric antrum.  Multiple biopsies were performed.   DUODENUM: Mild duodenal inflammation was found in the duodenal bulb.   The duodenal mucosa showed no abnormalities in the 2nd part of the duodenum.  COMPLICATIONS:   None  ENDOSCOPIC IMPRESSION: 1.   EROSIVE ESOPAHGITIS DUE TO UNCONTROLLED REFLUX. 2.   Small hiatal hernia 3.   MILD  gastritis & DUODENITIS 4.   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  PEPCID BID PRN. LOW FAT/HIGH FIBER DIET. LOSE 10 LBS. AWAIT BIOPSY. REPEAT EGD IN 1 YEAR IF BARRETT'S ESOPHAGUS CONFIRMED FOLLOW UP IN 4 MOS.   REPEAT EXAM:   _______________________________ Rosalie DoctorJonette Eva, MD 10/17/2012 10:38 AM Revised: 10/17/2012 10:38 AM      PATIENT NAME:  Michael Adkins MR#: [id number]

## 2012-10-17 NOTE — Op Note (Addendum)
[facility] [address] [place], [address]   COLONOSCOPY PROCEDURE REPORT  PATIENT: Michael Adkins, Michael Adkins  MR#: [id number] BIRTHDATE: 1946/07/09 , 66  yrs. old GENDER: Male ENDOSCOPIST: Jonette Eva, MD REFERRED BY:[name], [name] PROCEDURE DATE:  10/17/2012 PROCEDURE:   Colonoscopy with snare polypectomy INDICATIONS:Rectal Bleeding-SML VOLUME. BROTHER HAD POLYPS. LAST TCS 2003-IH. MEDICATIONS: Demerol 50 mg IV and Versed 6 mg IV  DESCRIPTION OF PROCEDURE:    Physical exam was performed.  Informed consent was obtained from the patient after explaining the benefits, risks, and alternatives to procedure.  The patient was connected to monitor and placed in left lateral position. Continuous oxygen was provided by nasal cannula and IV medicine administered through an indwelling cannula.  After administration of sedation and rectal exam, the patients rectum was intubated and the Pentax Colonoscope [phone]  colonoscope was advanced under direct visualization to the cecum.  The scope was removed slowly by carefully examining the color, texture, anatomy, and integrity mucosa on the way out.  The patient was recovered in endoscopy and discharged home in satisfactory condition.     COLON FINDINGS: A sessile polyp measuring 6 mm in size was found at the splenic flexure.  A polypectomy was performed using snare cautery.  , Mild diverticulosis was noted in the transverse colon. , Moderate diverticulosis was noted in the descending colon and sigmoid colon.  , and Moderate sized internal hemorrhoids were found.  PREP QUALITY: excellent. CECAL W/D TIME: 14 minutes  COMPLICATIONS: None  ENDOSCOPIC IMPRESSION: 1.   Sessile polyp-splenic flexure 2.   Mild diverticulosis  in the transverse colon 3.   Moderate diverticulosis in the descending colon and sigmoid colon 4.   Moderate sized internal hemorrhoids   RECOMMENDATIONS: AWAIT BIOPSY HIGH FIBER DIET CONSIDER TCS IN  10-15 YEARS IF BENEFITS OUTWEIGH THE RISKS       _______________________________ Rosalie DoctorJonette Eva, MD 10/17/2012 10:36 AM Revised: 10/17/2012 10:36 AM

## 2012-10-17 NOTE — H&P (Addendum)
  Primary Care Physician:  Carylon Perches, MD Primary Gastroenterologist:  Dr. Darrick Penna  Pre-Procedure History & Physical: HPI:  Michael Adkins is a 67 y.o. male here for  BRBPR/GERD-SCREEN FoR BARRETT'S.  Past Medical History  Diagnosis Date  . Osteoarthritis   . Hyperlipidemia   . Impaired fasting glucose   . IBS (irritable bowel syndrome)   . Skin cancer     cannot remember type,  Dermatology  . GERD (gastroesophageal reflux disease)     Past Surgical History  Procedure Laterality Date  . Colonoscopy  06/27/2002    RMR: Internal hemorrhoids, otherwise normal rectum/ Left-sided diverticula.  The remaining colonic mucosa appeared normal  . 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 Left hydrocele  08/06/2005  . Total knee arthroplasty  2011    right  . Wrist surgery    . Tonsillectomy      Prior to Admission medications   Medication Sig Start Date End Date Taking? Authorizing Provider  aspirin 81 MG tablet Take 81 mg by mouth daily.   Yes Historical Provider, MD  famotidine (PEPCID) 10 MG tablet Take 10 mg by mouth 2 (two) times daily. Acid indigestion   Yes Historical Provider, MD  polyethylene glycol-electrolytes (TRILYTE) 420 G solution Take 4,000 mLs by mouth as directed. 10/03/12  Yes [place], MD  simvastatin (ZOCOR) 40 MG tablet Take 40 mg by mouth at bedtime.  07/11/12  Yes Historical Provider, MD    Allergies as of 10/03/2012  . (No Known Allergies)    Family History  Problem Relation Age of Onset  . Colon polyps Brother 49  . Lung cancer Brother 67  . COPD Mother     History   Social History  . Marital Status: Married    Spouse Name: N/A    Number of Children: N/A  . Years of Education: N/A   Occupational History  . retired, Administrator, arts runner    Social History Main Topics  . Smoking status: Former Smoker -- 1.00 packs/day for 30 years    Types: Cigarettes    Quit date: 10/03/1990  . Smokeless tobacco: Not on file  . Alcohol Use: No   . Drug Use: No  . Sexually Active: Not on file   Other Topics Concern  . Not on file   Social History Narrative   Lives w/ wife, has 1 step-son             Review of Systems: See HPI, otherwise negative ROS   Physical Exam: BP 151/91  Pulse 79  Temp(Src) 97.7 F (36.5 C) (Oral)  Resp 18  SpO2 95% General:   Alert,  pleasant and cooperative in NAD Head:  Normocephalic and atraumatic. Neck:  Supple; Lungs:  Clear throughout to auscultation.    Heart:  Regular rate and rhythm. Abdomen:  Soft, nontender and nondistended. Normal bowel sounds, without guarding, and without rebound.   Neurologic:  Alert and  oriented x4;  grossly normal neurologically.  Impression/Plan:     BRBPR/GERD  PLAN: TCS/EGD TODAY

## 2012-10-21 ENCOUNTER — Other Ambulatory Visit: Payer: Self-pay

## 2012-10-25 ENCOUNTER — Encounter (HOSPITAL_COMMUNITY): Payer: Self-pay | Admitting: Gastroenterology

## 2012-10-26 ENCOUNTER — Telehealth: Payer: Self-pay | Admitting: Gastroenterology

## 2012-10-26 NOTE — Telephone Encounter (Signed)
Pt said his My chart is not showing that he had his flu shot , pneumonia shot and shingles shot. 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 He wants me to check with his PCP ( Dr. Ouida Sills ) and see if we can get a copy so we can enter into his file. I will fax over a request to Dr. Alonza Smoker nurse, Jewel.

## 2012-10-26 NOTE — Telephone Encounter (Signed)
Called and informed pt. Rx's called to Michael Adkins at [facility].

## 2012-10-26 NOTE — Telephone Encounter (Signed)
Path faxed to PCP, recalls made 

## 2012-10-26 NOTE — Telephone Encounter (Signed)
PLEASE CALL PT. HIS stomach Bx showed H. Pylori infection. He needs AMOXICILLIN 500 mg 2 po BID for 10 days and Biaxin 500 mg po bid for 10 days. CONTINUE OMEPRAZOLE.  TAKE 30 MINUTES PRIOR TO YOUR MEALS BID FOR 3 MOS THEN 30 MINS PRIOR TO YOUR FIRST MEAL FOREVER. Med side effects include NVD, abd pain, and metallic taste. REPEAT EGD IN 1 YEAR.  USE PEPCID AS NEEDED. LOSE 10 LBS. AVOID ITEMS THAT TRIGGER GASTRITIS.  FOLLOW A HIGH FIBER/LOW FAT DIET. AVOID ITEMS THAT CAUSE BLOATING.  FOLLOW UP IN JUN 2014. Next colonoscopy in 10 years.

## 2012-10-30 NOTE — Telephone Encounter (Signed)
Pt returned call. I informed him that I could only document the verbal message that I got from Jewel, I cannot document the injections under vaccinations. 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 He said he might get that documentation to have entered.

## 2012-10-30 NOTE — Telephone Encounter (Signed)
LMOM to call.

## 2012-10-30 NOTE — Telephone Encounter (Signed)
T/C from [facility] at Dr. Alonza Smoker.   1. Flu shot     No documentation for this year. 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 2. Pneumovax    07/29/2009 3. Tdap                  04/28/2011 4.  Zostervix           09/2007

## 2013-01-11 NOTE — Progress Notes (Signed)
2014 EGD H PYLORI TCS SIMPLE ADENOMA

## 2013-02-20 ENCOUNTER — Encounter: Payer: Self-pay | Admitting: Gastroenterology

## 2013-02-23 ENCOUNTER — Telehealth: Payer: Self-pay | Admitting: Gastroenterology

## 2013-02-23 NOTE — Telephone Encounter (Signed)
Pt is on our June recall to follow up with [facility] for post procedure. Pt said he is feeling much better and if SF needs to see him for me to call him back. Otherwise, he is doing fine. Please advise if he still needs OV or can I nic him farther out?

## 2013-04-11 ENCOUNTER — Other Ambulatory Visit: Payer: Self-pay

## 2013-07-12 ENCOUNTER — Other Ambulatory Visit: Payer: Self-pay

## 2013-10-10 ENCOUNTER — Telehealth: Payer: Self-pay | Admitting: Gastroenterology

## 2013-10-10 ENCOUNTER — Other Ambulatory Visit: Payer: Self-pay | Admitting: Gastroenterology

## 2013-10-10 DIAGNOSIS — K2270 Barrett's esophagus without dysplasia: Secondary | ICD-10-CM

## 2013-10-10 MED ORDER — OMEPRAZOLE 20 MG PO CPDR: DELAYED_RELEASE_CAPSULE | ORAL | Status: DC

## 2013-10-10 NOTE — Telephone Encounter (Signed)
PT HAS RARE HEARTBURN. [employer] WILL ONLY PAY FOR ONCE DAILY. RX SENT. EXPLAINED WHY PT SHOULD HAVE EGD DUE TO SCREENING FOR ADVANCED CHANGES IN BARRETT'S ESOPHAGUS THAT CAN LEAD TO ESO CANCER. EXPLAINED WHY PT SHOULD HAVE EGD DONE AND THAT IF NO ADVANCED CHANGES HE MAY GO 3 YEARS UNTIL HIS NEXT EGD.  SCHEDULE EGD FOR FEB 10.

## 2013-10-10 NOTE — Telephone Encounter (Signed)
Pt is on our recall list for February to have a one year EGD. He isn't sure if it wants to have this done or not. He asked if SF thought it was really necessary or not. I told him that I had him on my recall list to have it done, but I would touch base with SF to see what she wanted to do. Patient also said that he takes Omeprazole twice a day and his insurance Nurse, mental health) does not cover that anymore unless it's once a day. He wants [place] to get with his insurance and do a prior authorization and he would bring [place] the papers for that. He asked for a return call from [facility] later today or he would call back to speak with her before she leaves today. Please advise [phone]

## 2013-10-10 NOTE — Telephone Encounter (Signed)
EGD is scheduled on Tues Feb 10th and I have mailed instructions as well has spoken to Michael Adkins

## 2013-10-12 ENCOUNTER — Encounter (HOSPITAL_COMMUNITY): Payer: Self-pay | Admitting: Pharmacy Technician

## 2013-10-16 ENCOUNTER — Ambulatory Visit (HOSPITAL_COMMUNITY)
Admission: RE | Admit: 2013-10-16 | Discharge: 2013-10-16 | Disposition: A | Discharge status: Home / Self Care | Payer: Medicare Other | Source: Ambulatory Visit | Attending: Gastroenterology | Admitting: Gastroenterology

## 2013-10-16 ENCOUNTER — Encounter (HOSPITAL_COMMUNITY): Payer: Self-pay | Admitting: *Deleted

## 2013-10-16 ENCOUNTER — Encounter (HOSPITAL_COMMUNITY)
Admission: RE | Disposition: A | Discharge status: Home / Self Care | Payer: Self-pay | Source: Ambulatory Visit | Attending: Gastroenterology

## 2013-10-16 DIAGNOSIS — Z7982 Long term (current) use of aspirin: Secondary | ICD-10-CM | POA: Insufficient documentation

## 2013-10-16 DIAGNOSIS — K2270 Barrett's esophagus without dysplasia: Secondary | ICD-10-CM | POA: Insufficient documentation

## 2013-10-16 DIAGNOSIS — K2990 Gastroduodenitis, unspecified, without bleeding: Secondary | ICD-10-CM

## 2013-10-16 DIAGNOSIS — Z1389 Encounter for screening for other disorder: Secondary | ICD-10-CM | POA: Insufficient documentation

## 2013-10-16 DIAGNOSIS — K921 Melena: Secondary | ICD-10-CM

## 2013-10-16 DIAGNOSIS — K219 Gastro-esophageal reflux disease without esophagitis: Secondary | ICD-10-CM

## 2013-10-16 DIAGNOSIS — E785 Hyperlipidemia, unspecified: Secondary | ICD-10-CM | POA: Insufficient documentation

## 2013-10-16 DIAGNOSIS — K449 Diaphragmatic hernia without obstruction or gangrene: Secondary | ICD-10-CM | POA: Insufficient documentation

## 2013-10-16 DIAGNOSIS — K2970 Gastritis, unspecified, without bleeding: Secondary | ICD-10-CM | POA: Insufficient documentation

## 2013-10-16 HISTORY — PX: ESOPHAGOGASTRODUODENOSCOPY: SHX5428

## 2013-10-16 SURGERY — EGD (ESOPHAGOGASTRODUODENOSCOPY)
Anesthesia: Moderate Sedation

## 2013-10-16 MED ORDER — LIDOCAINE VISCOUS 2 % MT SOLN
OROMUCOSAL | Status: DC | PRN
  Administered 2013-10-16: 3 mL via OROMUCOSAL

## 2013-10-16 MED ORDER — MIDAZOLAM HCL 5 MG/5ML IJ SOLN
INTRAMUSCULAR | Status: DC | PRN
  Administered 2013-10-16 (×2): 2 mg via INTRAVENOUS
  Administered 2013-10-16: 1 mg via INTRAVENOUS

## 2013-10-16 MED ORDER — MIDAZOLAM HCL 5 MG/5ML IJ SOLN
INTRAMUSCULAR | Status: AC
  Filled 2013-10-16: qty 10

## 2013-10-16 MED ORDER — MEPERIDINE HCL 100 MG/ML IJ SOLN
INTRAMUSCULAR | Status: AC
  Filled 2013-10-16: qty 2

## 2013-10-16 MED ORDER — MEPERIDINE HCL 100 MG/ML IJ SOLN
INTRAMUSCULAR | Status: DC | PRN
  Administered 2013-10-16: 50 mg via INTRAVENOUS
  Administered 2013-10-16: 25 mg via INTRAVENOUS

## 2013-10-16 NOTE — H&P (Signed)
  Primary Care Physician:  Asencion Noble, MD Primary Gastroenterologist:  Dr. Oneida Alar  Pre-Procedure History & Physical: HPI:  Michael Adkins is a 68 y.o. male here for SCREENING FOR BARRETT'S ESOPHAGUS.  Past Medical History  Diagnosis Date  . Osteoarthritis   . Hyperlipidemia   . Impaired fasting glucose   . IBS (irritable bowel syndrome)   . Skin cancer     cannot remember type, [facility]  . GERD (gastroesophageal reflux disease)     Past Surgical History  Procedure Laterality Date  . Colonoscopy  06/27/2002    RMR: Internal hemorrhoids, otherwise normal rectum/ Left-sided diverticula.  The remaining colonic mucosa appeared normal  . 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 Left hydrocele  08/06/2005  . Total knee arthroplasty  2011    right  . Wrist surgery    . Tonsillectomy    . Colonoscopy with esophagogastroduodenoscopy (egd) N/A 10/17/2012    Procedure: COLONOSCOPY WITH ESOPHAGOGASTRODUODENOSCOPY (EGD);  Surgeon: Danie Binder, MD;  Location: [facility];  Service: Endoscopy;  Laterality: N/A;  9:45-changed to 9:30 Michael Adkins to notify pt    Prior to Admission medications   Medication Sig Start Date End Date Taking? Authorizing Provider  aspirin 81 MG tablet Take 81 mg by mouth daily.   Yes Historical Provider, MD  famotidine (PEPCID) 10 MG tablet Take 1 tablet (10 mg total) by mouth 2 (two) times daily. Acid indigestion 10/17/12  Yes Danie Binder, MD  omeprazole (PRILOSEC) 20 MG capsule 1 po bid 30 minutes before BREAKFAST FOREVER 10/10/13  Yes Danie Binder, MD  simvastatin (ZOCOR) 40 MG tablet Take 40 mg by mouth at bedtime.  07/11/12  Yes Historical Provider, MD    Allergies as of 10/10/2013  . (No Known Allergies)    Family History  Problem Relation Age of Onset  . Colon polyps Brother 24  . Lung cancer Brother 84  . COPD Mother     History   Social History  . Marital Status: Married    Spouse Name: N/A    Number of Children: N/A  .  Years of Education: N/A   Occupational History  . retired, Administrator, sports runner    Social History Main Topics  . Smoking status: Former Smoker -- 1.00 packs/day for 30 years    Types: Cigarettes    Quit date: 10/03/1990  . Smokeless tobacco: Not on file  . Alcohol Use: 0.6 oz/week    1 Glasses of wine per week  . Drug Use: No  . Sexual Activity: Yes   Other Topics Concern  . Not on file   Social History Narrative   Lives w/ wife, has 1 step-son             Review of Systems: See HPI, otherwise negative ROS   Physical Exam: BP 163/98  Pulse 78  Temp(Src) 97.3 F (36.3 C) (Oral)  Resp 22  Ht 6' (1.829 m)  Wt 230 lb (104.327 kg)  BMI 31.19 kg/m2  SpO2 100% General:   Alert,  pleasant and cooperative in NAD Head:  Normocephalic and atraumatic. Neck:  Supple; Lungs:  Clear throughout to auscultation.    Heart:  Regular rate and rhythm. Abdomen:  Soft, nontender and nondistended. Normal bowel sounds, without guarding, and without rebound.   Neurologic:  Alert and  oriented x4;  grossly normal neurologically.  Impression/Plan:    SCREENING FOR BARRETT'S ESOPHAGUS.  PLAN:  1.EGD TODAY

## 2013-10-16 NOTE — Discharge Instructions (Signed)
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 IT LOOKS THE SAME AS IT DID LAST YEAR. YOU HAVE A MODERATE SIZE HIATAL HERNIA & gastritis. I biopsied your ESOPHAGUS & stomach.   TAKE OMPERAZOLE 30 MINUTES PRIOR TO A MEAL ONCE DAILY FOREVER.  FOLLOW A LOW FAT DIET. SEE INFO BELOW.  REPEAT EGD IN 3 YEARS IF THE BENEFITS OUTWEIGH THE RISKS.   UPPER ENDOSCOPY AFTER CARE Read the instructions outlined below and refer to this sheet in the next week. These discharge instructions provide you with general information on caring for yourself after you leave the hospital. While your treatment has been planned according to the most current medical practices available, unavoidable complications occasionally occur. If you have any problems or questions after discharge, call DR. Millena Callins, [phone].  ACTIVITY  You may resume your regular activity, but move at a slower pace for the next 24 hours.   Take frequent rest periods for the next 24 hours.   Walking will help get rid of the air and reduce the bloated feeling in your belly (abdomen).   No driving for 24 hours (because of the medicine (anesthesia) used during the test).   You may shower.   Do not sign any important legal documents or operate any machinery for 24 hours (because of the anesthesia used during the test).    NUTRITION  Drink plenty of fluids.   You may resume your normal diet as instructed by your doctor.   Begin with a light meal and progress to your normal diet. Heavy or fried foods are harder to digest and may make you feel sick to your stomach (nauseated).   Avoid alcoholic beverages for 24 hours or as instructed.    MEDICATIONS  You may resume your normal medications.   WHAT YOU CAN EXPECT TODAY  Some feelings of bloating in the abdomen.   Passage of more gas than usual.    IF YOU HAD A BIOPSY TAKEN DURING THE UPPER ENDOSCOPY:  Eat a soft diet IF YOU HAVE NAUSEA, BLOATING, ABDOMINAL PAIN, OR VOMITING.    FINDING OUT THE  RESULTS OF YOUR TEST Not all test results are available during your visit. DR. Oneida Alar WILL CALL YOU WITHIN 7 DAYS OF YOUR PROCEDUE WITH YOUR RESULTS. Do not assume everything is normal if you have not heard from DR. Manuel Lawhead IN ONE WEEK, CALL HER OFFICE AT [phone].  SEEK IMMEDIATE MEDICAL ATTENTION AND CALL THE OFFICE: [phone] IF:  You have more than a spotting of blood in your stool.   Your belly is swollen (abdominal distention).   You are nauseated or vomiting.   You have a temperature over 101F.   You have abdominal pain or discomfort that is severe or gets worse throughout the day.   Hiatal Hernia A hiatal hernia occurs when a part of the stomach slides above the diaphragm. The diaphragm is the thin muscle separating the belly (abdomen) from the chest. A hiatal hernia can be something you are born with or develop over time. Hiatal hernias may allow stomach acid to flow back into your esophagus, the tube which carries food from your mouth to your stomach. If this acid causes problems it is called GERD (gastro-esophageal reflux disease).   SYMPTOMS Common symptoms of GERD are heartburn (burning in your chest). This is worse when lying down or bending over. It may also cause belching and indigestion. Some of the things which make GERD worse are:  Increased weight pushes on stomach making acid rise more easily.  Smoking markedly increases acid production.   Alcohol decreases lower esophageal sphincter pressure (valve between stomach and esophagus), allowing acid from stomach into esophagus.   Late evening meals and going to bed with a full stomach increases pressure.   Anything that causes an increase in acid production.   Lower esophageal sphincter incompetence.    HOME CARE INSTRUCTIONS  Try to achieve and maintain an ideal body weight.   Avoid drinking alcoholic beverages.   Stop smoking.   Put the head of your bed on 4 to 6 inch blocks. This will keep your  head and esophagus higher than your stomach. If you cannot use blocks, sleep with several pillows under your head and shoulders.   MINIMIZE THE USE OF aspirin, ibuprofen (Advil or Motrin), or other nonsteroidal anti-inflammatory drugs.   Do not wear tight clothing around your chest or stomach.   Eat smaller meals and eat more frequently. This keeps your stomach from getting too full. Eat slowly.   Do not lie down for 2 or 3 hours after eating. Do not eat or drink anything 1 to 2 hours before going to bed.   Avoid caffeine beverages (colas, coffee, cocoa, tea), fatty foods, citrus fruits and all other foods and drinks that contain acid and that seem to increase the problems.   Avoid bending over, especially after eating. Also avoid straining during bowel movements or when urinating or lifting things. Anything that increases the pressure in your belly increases the amount of acid that may be pushed up into your esophagus.    Gastritis  Gastritis is an inflammation (the body's way of reacting to injury and/or infection) of the stomach. It is often caused by viral or bacterial (germ) infections. It can also be caused BY ASPIRIN, BC/GOODY POWDER'S, (IBUPROFEN) MOTRIN, OR ALEVE (NAPROXEN), chemicals (including alcohol), SPICY FOODS, and medications. This illness may be associated with generalized malaise (feeling tired, not well), UPPER ABDOMINAL STOMACH cramps, and fever. One common bacterial cause of gastritis is an organism known as H. Pylori. This can be treated with antibiotics.    Low-Fat Diet  BREADS, CEREALS, PASTA, RICE, DRIED PEAS, AND BEANS These products are high in carbohydrates and most are low in fat. Therefore, they can be increased in the diet as substitutes for fatty foods. They too, however, contain calories and should not be eaten in excess. Cereals can be eaten for snacks as well as for breakfast.  Include foods that contain fiber (fruits, vegetables, whole grains, and  legumes). Research shows that fiber may lower blood cholesterol levels, especially the water-soluble fiber found in fruits, vegetables, oat products, and legumes.  FRUITS AND VEGETABLES It is good to eat fruits and vegetables. Besides being sources of fiber, both are rich in vitamins and some minerals. They help you get the daily allowances of these nutrients. Fruits and vegetables can be used for snacks and desserts.  MEATS Limit lean meat, chicken, [place], and fish to no more than 6 ounces per day.  Beef, Pork, and Lamb Use lean cuts of beef, pork, and lamb. Lean cuts include:  Extra-lean ground beef.  Arm roast.  Sirloin tip.  Center-cut ham.  Round steak.  Loin chops.  Rump roast.  Tenderloin.  Trim all fat off the outside of meats before cooking. It is not necessary to severely decrease the intake of red meat, but lean choices should be made. Lean meat is rich in protein and contains a highly absorbable form of iron. Premenopausal women, in particular, should  avoid reducing lean red meat because this could increase the risk for low red blood cells (iron-deficiency anemia).  Chicken and [place] These are good sources of protein. The fat of poultry can be reduced by removing the skin and underlying fat layers before cooking. Chicken and [place] can be substituted for lean red meat in the diet. Poultry should not be fried or covered with high-fat sauces.  Fish and Shellfish Fish is a good source of protein. Shellfish contain cholesterol, but they usually are low in saturated fatty acids. The preparation of fish is important. Like chicken and [place], they should not be fried or covered with high-fat sauces.  EGGS Egg whites contain no fat or cholesterol. They can be eaten often. Try 1 to 2 egg whites instead of whole eggs in recipes or use egg substitutes that do not contain yolk.  MILK AND DAIRY PRODUCTS Use skim or 1% milk instead of 2% or whole milk. Decrease whole milk, natural, and  processed cheeses. Use nonfat or low-fat (2%) cottage cheese or low-fat cheeses made from vegetable oils. Choose nonfat or low-fat (1 to 2%) yogurt. Experiment with evaporated skim milk in recipes that call for heavy cream. Substitute low-fat yogurt or low-fat cottage cheese for sour cream in dips and salad dressings. Have at least 2 servings of low-fat dairy products, such as 2 glasses of skim (or 1%) milk each day to help get your daily calcium intake.  FATS AND OILS Reduce the total intake of fats, especially saturated fat. Butterfat, lard, and beef fats are high in saturated fat and cholesterol. These should be avoided as much as possible. Vegetable fats do not contain cholesterol, but certain vegetable fats, such as coconut oil, palm oil, and palm kernel oil are very high in saturated fats. These should be limited. These fats are often used in bakery goods, processed foods, popcorn, oils, and nondairy creamers. Vegetable shortenings and some peanut butters contain hydrogenated oils, which are also saturated fats. Read the labels on these foods and check for saturated vegetable oils.  Unsaturated vegetable oils and fats do not raise blood cholesterol. However, they should be limited because they are fats and are high in calories. Total fat should still be limited to 30% of your daily caloric intake. Desirable liquid vegetable oils are corn oil, cottonseed oil, olive oil, canola oil, safflower oil, soybean oil, and sunflower oil. Peanut oil is not as good, but small amounts are acceptable. Buy a heart-healthy tub margarine that has no partially hydrogenated oils in the ingredients. Mayonnaise and salad dressings often are made from unsaturated fats, but they should also be limited because of their high calorie and fat content. Seeds, nuts, peanut butter, olives, and avocados are high in fat, but the fat is mainly the unsaturated type. These foods should be limited mainly to avoid excess calories and  fat.  OTHER EATING TIPS Snacks  Most sweets should be limited as snacks. They tend to be rich in calories and fats, and their caloric content outweighs their nutritional value. Some good choices in snacks are graham crackers, melba toast, soda crackers, bagels (no egg), English muffins, fruits, and vegetables. These snacks are preferable to snack crackers, [place] fries, and chips. Popcorn should be air-popped or cooked in small amounts of liquid vegetable oil.  Desserts Eat fruit, low-fat yogurt, and fruit ices instead of pastries, cake, and cookies. Sherbet, angel food cake, gelatin dessert, frozen low-fat yogurt, or other frozen products that do not contain saturated fat (pure fruit juice  bars, frozen ice pops) are also acceptable.   COOKING METHODS Choose those methods that use little or no fat. They include: Poaching.  Braising.  Steaming.  Grilling.  Baking.  Stir-frying.  Broiling.  Microwaving.  Foods can be cooked in a nonstick pan without added fat, or use a nonfat cooking spray in regular cookware. Limit fried foods and avoid frying in saturated fat. Add moisture to lean meats by using water, broth, cooking wines, and other nonfat or low-fat sauces along with the cooking methods mentioned above. Soups and stews should be chilled after cooking. The fat that forms on top after a few hours in the refrigerator should be skimmed off. When preparing meals, avoid using excess salt. Salt can contribute to raising blood pressure in some people.  EATING AWAY FROM HOME Order entres, potatoes, and vegetables without sauces or butter. When meat exceeds the size of a deck of cards (3 to 4 ounces), the rest can be taken home for another meal. Choose vegetable or fruit salads and ask for low-calorie salad dressings to be served on the side. Use dressings sparingly. Limit high-fat toppings, such as bacon, crumbled eggs, cheese, sunflower seeds.

## 2013-10-16 NOTE — Op Note (Signed)
[facility] [address] [place], [address]   ENDOSCOPY PROCEDURE REPORT  PATIENT: Michael Adkins, Michael Adkins  MR#: [id number] BIRTHDATE: [birth year] , 67  yrs. old GENDER: Male  ENDOSCOPIST: Barney Drain, MD REFERRED BY:Roy Willey Blade, [name]  PROCEDURE DATE: 10/16/2013 PROCEDURE:   EGD w/ biopsy  INDICATIONS:Barrett's screening. PT TAKES ASA DAILY. MEDICATIONS: Demerol 75 mg IV and Versed 5 mg IV TOPICAL ANESTHETIC:   Viscous Xylocaine  DESCRIPTION OF PROCEDURE:     Physical exam was performed.  Informed consent was obtained from the patient after explaining the benefits, risks, and alternatives to the procedure.  The patient was connected to the monitor and placed in the left lateral position.  Continuous oxygen was provided by nasal cannula and IV medicine administered through an indwelling cannula.  After administration of sedation, the patients esophagus was intubated and the EG-[id number] ([id number])  endoscope was advanced under direct visualization to the second portion of the duodenum.  The scope was removed slowly by carefully examining the color, texture, anatomy, and integrity of the mucosa on the way out.  The patient was recovered in endoscopy and discharged home in satisfactory condition.   ESOPHAGUS: There was evidence of Barrett's esophagus at the gastroesophageal junction.  Multiple biopsies were performed using cold forceps.   A medium sized hiatal hernia was noted.   STOMACH: Mild non-erosive gastritis (inflammation) was found in the gastric antrum.  Multiple biopsies were performed using cold forceps. DUODENUM: The duodenal mucosa showed no abnormalities in the bulb and second portion of the duodenum. COMPLICATIONS:   None  ENDOSCOPIC IMPRESSION: 1.   SHORT SEGMENT Barrett's esophagus 2.   MODERATE hiatal hernia 3.   MILD Non-erosive gastritis  RECOMMENDATIONS: TAKE OMPERAZOLE 30 MINUTES PRIOR TO A MEAL ONCE DAILY FOREVER. FOLLOW A LOW FAT  DIET. AWAIT BIOPSY. REPEAT EGD IN 3 YEARS IF THE BENEFITS OUTWEIGH THE RISKS.   REPEAT EXAM:   _______________________________ Lorrin MaisBarney Drain, MD 10/16/2013 2:52 PM

## 2013-10-16 NOTE — Telephone Encounter (Signed)
Pt is aware of the reason for the EGD.

## 2013-10-19 ENCOUNTER — Encounter (HOSPITAL_COMMUNITY): Payer: Self-pay | Admitting: Gastroenterology

## 2013-10-19 ENCOUNTER — Telehealth: Payer: Self-pay | Admitting: Gastroenterology

## 2013-10-19 NOTE — Telephone Encounter (Signed)
Please call pt. The biopsies show Barrett's & GASTRITIS.   TAKE OMPERAZOLE 30 MINUTES PRIOR TO A MEAL ONCE DAILY FOREVER. FOLLOW A LOW FAT DIET.  REPEAT EGD IN 3 YEARS IF THE BENEFITS OUTWEIGH THE RISKS.

## 2013-10-22 NOTE — Telephone Encounter (Signed)
Called and informed pt.  

## 2013-10-22 NOTE — Telephone Encounter (Signed)
Reminder in EPIC 

## 2014-02-25 ENCOUNTER — Telehealth: Payer: Self-pay | Admitting: Gastroenterology

## 2014-02-25 NOTE — Telephone Encounter (Signed)
Pt came by office today to say that he needed SF to call in his Prilosec rx to [facility] in [place] and it needed to be written as once a day for 30 pills so his insurance will cover it.

## 2014-02-26 NOTE — Telephone Encounter (Signed)
Routing to Dr. Fields.  

## 2014-02-27 MED ORDER — OMEPRAZOLE 20 MG PO CPDR: DELAYED_RELEASE_CAPSULE | ORAL | Status: DC

## 2014-02-27 MED ORDER — OMEPRAZOLE 20 MG PO CPDR: DELAYED_RELEASE_CAPSULE | ORAL | Status: AC

## 2014-02-27 NOTE — Telephone Encounter (Signed)
I called to tell pt and he wanted to make sure it is for once a day.  PLEASE SEND RX FOR ONCE A DAY. INSURANCE WILL NOT COVER BID.

## 2014-02-27 NOTE — Addendum Note (Signed)
Addended by: Danie Binder on: 02/27/2014 05:44 PM   Modules accepted: Orders

## 2014-02-27 NOTE — Telephone Encounter (Signed)
PLEASE CALL PT.  Rx sent.  

## 2014-02-27 NOTE — Telephone Encounter (Signed)
Pt is aware and I called the pharmacy to cancel the prescription for the one for bid.

## 2014-06-21 ENCOUNTER — Other Ambulatory Visit: Payer: Self-pay

## 2015-03-01 ENCOUNTER — Other Ambulatory Visit: Payer: Self-pay | Admitting: Gastroenterology

## 2015-03-03 ENCOUNTER — Other Ambulatory Visit: Payer: Self-pay

## 2015-03-03 NOTE — Telephone Encounter (Signed)
Pt called and said he has moved away, and been gone ( I think he said [facility]) since 06/2014. He thought pharmacist was sending the request to the doctor who originally gave him the prescription.  Routing back to Neil Crouch, PA to advise!

## 2015-03-03 NOTE — Telephone Encounter (Signed)
Nic for SF 3 months

## 2015-03-03 NOTE — Telephone Encounter (Signed)
Refill done. Need OV with [facility] in 3 months.

## 2015-03-03 NOTE — Telephone Encounter (Signed)
LMOM that he needs Ov with Dr. Oneida Alar in 3 months. Routing to [employer] to nic.

## 2015-03-04 NOTE — Telephone Encounter (Signed)
PT is aware.

## 2015-03-04 NOTE — Telephone Encounter (Signed)
If he has moved away, he can follow up with his local physician.  He should establish care with GI for h/o Barrett's. He is due for EGD in 10/2016.

## 2015-04-07 ENCOUNTER — Encounter: Payer: Self-pay | Admitting: Gastroenterology

## 2015-09-12 ENCOUNTER — Other Ambulatory Visit: Payer: Self-pay | Admitting: Gastroenterology

## 2016-09-01 ENCOUNTER — Encounter: Payer: Self-pay | Admitting: Gastroenterology

## 2016-09-14 ENCOUNTER — Telehealth: Payer: Self-pay | Admitting: Gastroenterology

## 2016-09-14 NOTE — Telephone Encounter (Signed)
PATIENT RECEIVED LETTERS FROM [facility] AND STATED HE IS SEEING ANOTHER GI OFFICE WHERE HE NOW LIVES.

## 2016-09-14 NOTE — Telephone Encounter (Signed)
Noted  

## 2022-09-27 ENCOUNTER — Encounter: Payer: Self-pay | Admitting: *Deleted
